# Patient Record
Sex: Male | Born: 2000 | Race: White | Hispanic: No | Marital: Single | Smoking: Never smoker
Health system: Southern US, Community
[De-identification: ages and names within clinical notes are randomized; demographics above are authoritative.]

---
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
## 2022-02-16 IMAGING — DX DG FINGER INDEX 2+V*L*
3 series · 3 of 3 positions shown · non-contrast
Comparison: None.

CLINICAL DATA: Laceration left index finger on [employer]
today. Initial encounter.

EXAM:
LEFT INDEX FINGER 2+V

[finger ap]
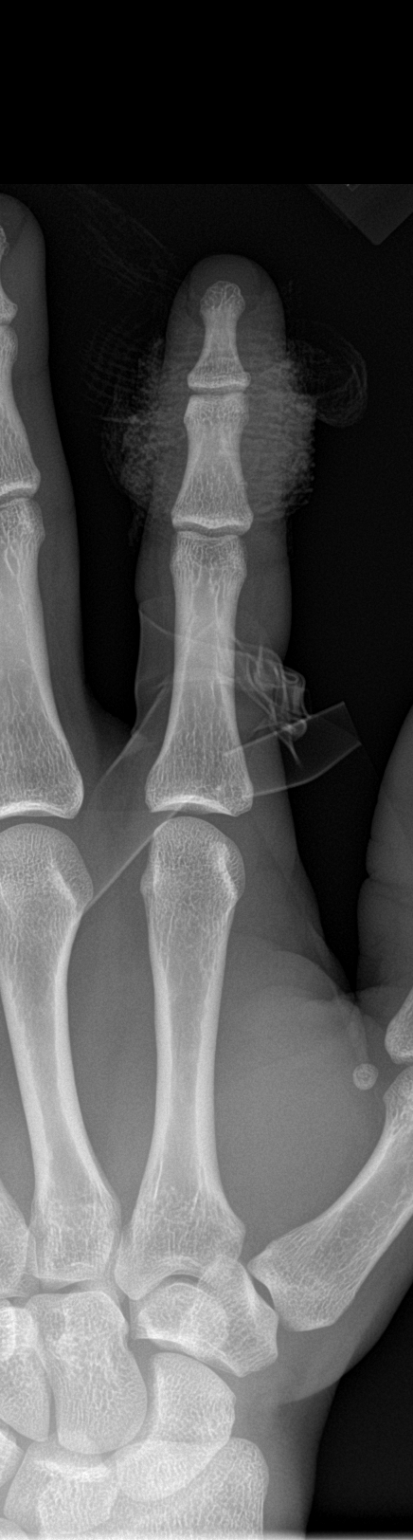

[finger obl]
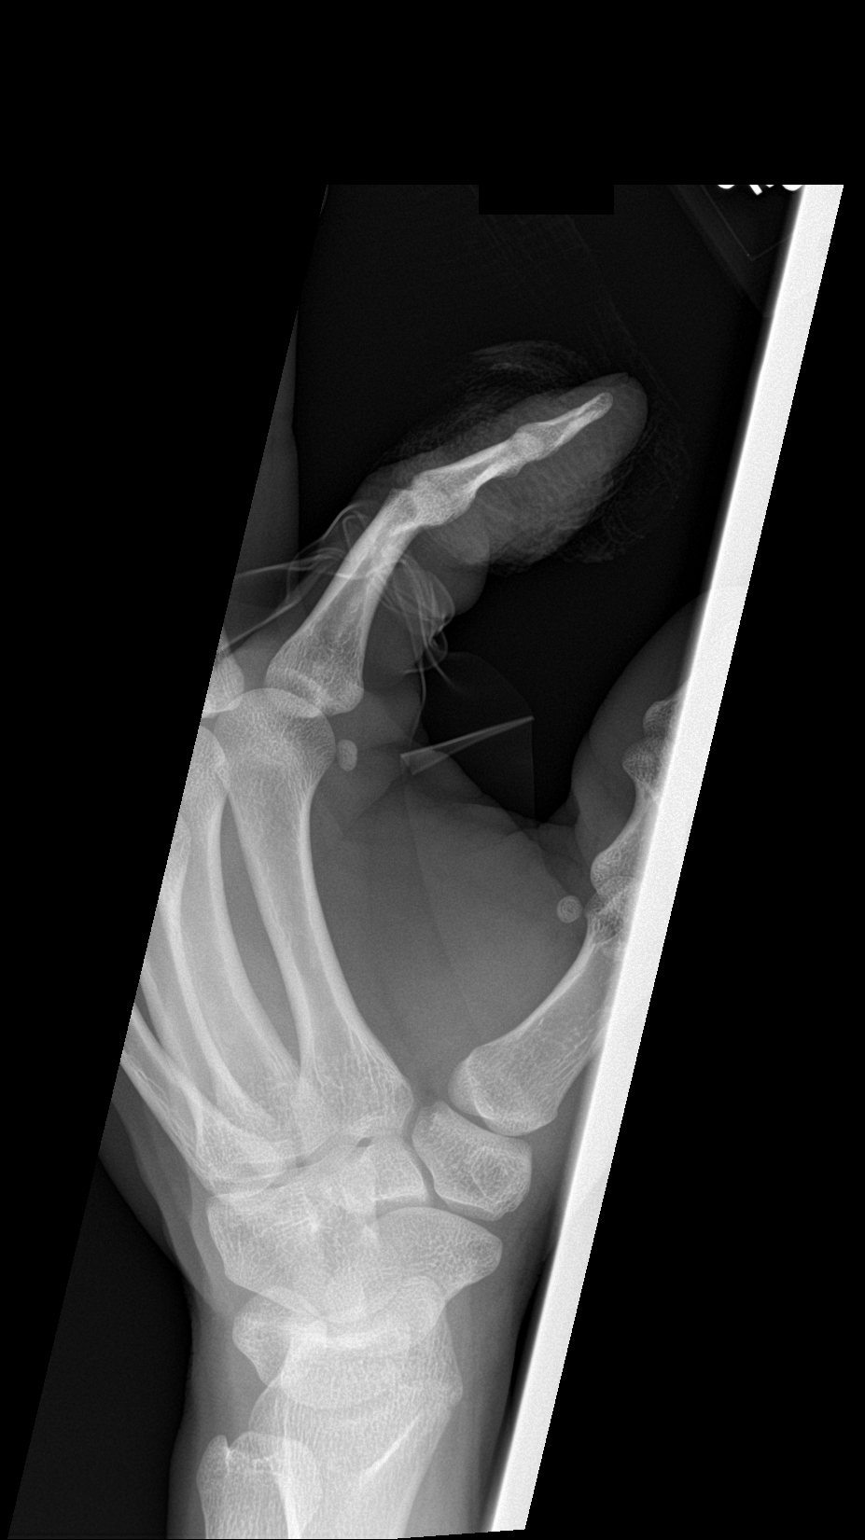

[finger lat]
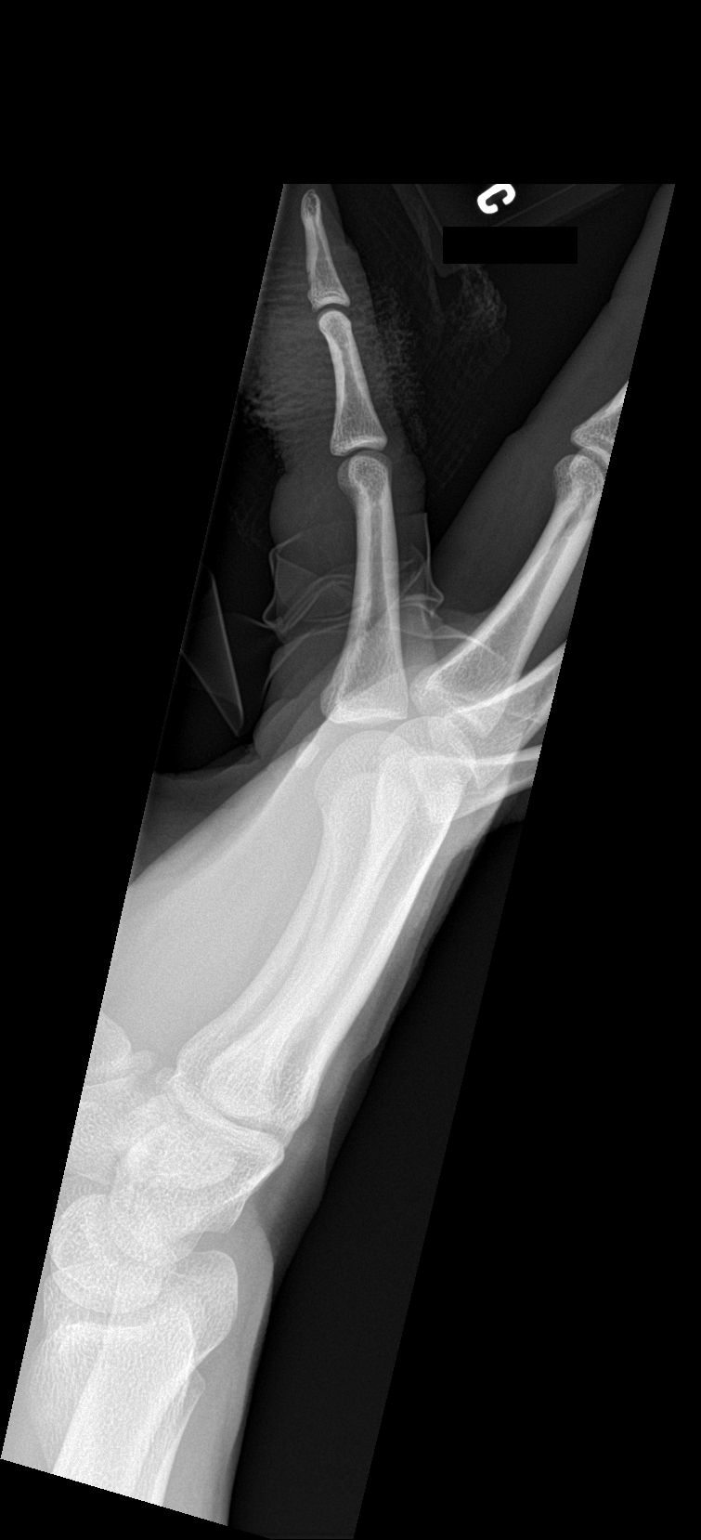

[3 of 3 positions shown; findings below may reference images not displayed]

FINDINGS: No fracture, dislocation or foreign body. Bandaging about the distal
aspect of the index finger noted.
IMPRESSION: Negative for fracture or foreign body.

## 2023-09-24 ENCOUNTER — Other Ambulatory Visit: Payer: Self-pay

## 2023-09-24 DIAGNOSIS — T22212A Burn of second degree of left forearm, initial encounter: Secondary | ICD-10-CM | POA: Insufficient documentation

## 2023-09-24 DIAGNOSIS — T22222A Burn of second degree of left elbow, initial encounter: Secondary | ICD-10-CM | POA: Insufficient documentation

## 2023-09-24 DIAGNOSIS — T23202A Burn of second degree of left hand, unspecified site, initial encounter: Secondary | ICD-10-CM | POA: Insufficient documentation

## 2023-09-24 DIAGNOSIS — T310 Burns involving less than 10% of body surface: Secondary | ICD-10-CM | POA: Insufficient documentation

## 2023-09-24 DIAGNOSIS — T2121XA Burn of second degree of chest wall, initial encounter: Secondary | ICD-10-CM | POA: Insufficient documentation

## 2023-09-24 DIAGNOSIS — T2122XA Burn of second degree of abdominal wall, initial encounter: Secondary | ICD-10-CM | POA: Insufficient documentation

## 2023-09-24 DIAGNOSIS — X088XXA Exposure to other specified smoke, fire and flames, initial encounter: Secondary | ICD-10-CM | POA: Insufficient documentation
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
## 2023-09-25 ENCOUNTER — Emergency Department
Admission: EM | Admit: 2023-09-25 | Discharge: 2023-09-25 | Disposition: A | Discharge status: Other Acute Inpatient Hospital | Payer: BC Managed Care – PPO | Attending: Emergency Medicine | Admitting: Emergency Medicine

## 2023-09-25 DIAGNOSIS — T300 Burn of unspecified body region, unspecified degree: Secondary | ICD-10-CM

## 2023-09-25 LAB — COMPREHENSIVE METABOLIC PANEL
ALT: 29 U/L (ref 0–44)
AST: 39 U/L (ref 15–41)
Albumin: 5 g/dL (ref 3.5–5.0)
Alkaline Phosphatase: 78 U/L (ref 38–126)
Anion gap: 12 (ref 5–15)
BUN: 19 mg/dL (ref 6–20)
CO2: 22 mmol/L (ref 22–32)
Calcium: 8.9 mg/dL (ref 8.9–10.3)
Chloride: 106 mmol/L (ref 98–111)
Creatinine, Ser: 1.15 mg/dL (ref 0.61–1.24)
GFR, Estimated: >60 mL/min (ref >60)
Glucose, Bld: 140 mg/dL — ABNORMAL HIGH (ref 70–99)
Potassium: 2.9 mmol/L — ABNORMAL LOW (ref 3.5–5.1)
Sodium: 140 mmol/L (ref 135–145)
Total Bilirubin: 0.7 mg/dL (ref 0.3–1.2)
Total Protein: 8.3 g/dL — ABNORMAL HIGH (ref 6.5–8.1)

## 2023-09-25 LAB — CBC WITH DIFFERENTIAL/PLATELET
Abs Immature Granulocytes: 0.08 10*3/uL — ABNORMAL HIGH (ref 0.00–0.07)
Basophils Absolute: 0.1 10*3/uL (ref 0.0–0.1)
Basophils Relative: 1 %
Eosinophils Absolute: 0.4 10*3/uL (ref 0.0–0.5)
Eosinophils Relative: 3 %
HCT: 47.2 % (ref 39.0–52.0)
Hemoglobin: 16.2 g/dL (ref 13.0–17.0)
Immature Granulocytes: 1 %
Lymphocytes Relative: 43 %
Lymphs Abs: 6.1 10*3/uL — ABNORMAL HIGH (ref 0.7–4.0)
MCH: 30.4 pg (ref 26.0–34.0)
MCHC: 34.3 g/dL (ref 30.0–36.0)
MCV: 88.6 fL (ref 80.0–100.0)
Monocytes Absolute: 1 10*3/uL (ref 0.1–1.0)
Monocytes Relative: 7 %
Neutro Abs: 6.5 10*3/uL (ref 1.7–7.7)
Neutrophils Relative %: 45 %
Platelets: 254 10*3/uL (ref 150–400)
RBC: 5.33 MIL/uL (ref 4.22–5.81)
RDW: 11.9 % (ref 11.5–15.5)
Smear Review: NORMAL
WBC: 14.1 10*3/uL — ABNORMAL HIGH (ref 4.0–10.5)
nRBC: 0 % (ref 0.0–0.2)

## 2023-09-25 MED ORDER — HYDROMORPHONE HCL 1 MG/ML IJ SOLN
1.0000 mg | Freq: Once | INTRAMUSCULAR | Status: AC
  Administered 2023-09-25: 1 mg via INTRAVENOUS
  Filled 2023-09-25: qty 1

## 2023-09-25 MED ORDER — LACTATED RINGERS IV SOLN: INTRAVENOUS | Status: DC

## 2023-09-25 MED ORDER — POTASSIUM CHLORIDE CRYS ER 20 MEQ PO TBCR
40.0000 meq | EXTENDED_RELEASE_TABLET | Freq: Once | ORAL | Status: AC
  Administered 2023-09-25: 40 meq via ORAL
  Filled 2023-09-25: qty 2

## 2023-09-25 MED ORDER — HYDROMORPHONE HCL 1 MG/ML IJ SOLN
INTRAMUSCULAR | Status: AC
  Filled 2023-09-25: qty 1

## 2023-09-25 MED ORDER — ONDANSETRON HCL 4 MG/2ML IJ SOLN
4.0000 mg | Freq: Once | INTRAMUSCULAR | Status: AC
  Administered 2023-09-25: 4 mg via INTRAVENOUS
  Filled 2023-09-25: qty 2
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
